# Patient Record
Sex: Female | Born: 1965 | Race: White | Hispanic: No | Marital: Married | Smoking: Never smoker
Health system: Southern US, Community
[De-identification: ages and names within clinical notes are randomized; demographics above are authoritative.]

## PROBLEM LIST (undated history)

## (undated) DIAGNOSIS — I10 Essential (primary) hypertension: Secondary | ICD-10-CM

## (undated) HISTORY — DX: Essential (primary) hypertension: I10

---

## 1997-06-17 ENCOUNTER — Other Ambulatory Visit: Admission: RE | Admit: 1997-06-17 | Discharge: 1997-06-17 | Payer: Self-pay | Admitting: Obstetrics and Gynecology

## 1998-06-23 ENCOUNTER — Other Ambulatory Visit: Admission: RE | Admit: 1998-06-23 | Discharge: 1998-06-23 | Payer: Self-pay | Admitting: Obstetrics and Gynecology

## 1999-07-01 ENCOUNTER — Other Ambulatory Visit: Admission: RE | Admit: 1999-07-01 | Discharge: 1999-07-01 | Payer: Self-pay | Admitting: Obstetrics and Gynecology

## 2000-06-27 ENCOUNTER — Other Ambulatory Visit: Admission: RE | Admit: 2000-06-27 | Discharge: 2000-06-27 | Payer: Self-pay | Admitting: Obstetrics and Gynecology

## 2001-07-06 ENCOUNTER — Other Ambulatory Visit: Admission: RE | Admit: 2001-07-06 | Discharge: 2001-07-06 | Payer: Self-pay | Admitting: Obstetrics and Gynecology

## 2002-07-09 ENCOUNTER — Other Ambulatory Visit: Admission: RE | Admit: 2002-07-09 | Discharge: 2002-07-09 | Payer: Self-pay | Admitting: Obstetrics and Gynecology

## 2003-07-23 ENCOUNTER — Other Ambulatory Visit: Admission: RE | Admit: 2003-07-23 | Discharge: 2003-07-23 | Payer: Self-pay | Admitting: Obstetrics and Gynecology

## 2004-08-06 ENCOUNTER — Other Ambulatory Visit: Admission: RE | Admit: 2004-08-06 | Discharge: 2004-08-06 | Payer: Self-pay | Admitting: Obstetrics and Gynecology

## 2005-10-27 ENCOUNTER — Ambulatory Visit (HOSPITAL_COMMUNITY): Admission: RE | Admit: 2005-10-27 | Discharge: 2005-10-27 | Payer: Self-pay | Admitting: Obstetrics and Gynecology

## 2006-02-02 ENCOUNTER — Ambulatory Visit (HOSPITAL_COMMUNITY): Admission: RE | Admit: 2006-02-02 | Discharge: 2006-02-02 | Payer: Self-pay | Admitting: Obstetrics and Gynecology

## 2010-03-14 ENCOUNTER — Encounter: Payer: Self-pay | Admitting: Obstetrics and Gynecology

## 2011-02-10 ENCOUNTER — Other Ambulatory Visit: Payer: Self-pay | Admitting: Obstetrics and Gynecology

## 2011-02-10 DIAGNOSIS — R928 Other abnormal and inconclusive findings on diagnostic imaging of breast: Secondary | ICD-10-CM

## 2011-02-18 ENCOUNTER — Ambulatory Visit
Admission: RE | Admit: 2011-02-18 | Discharge: 2011-02-18 | Disposition: A | Discharge status: Home / Self Care | Payer: BC Managed Care – PPO | Source: Ambulatory Visit | Attending: Obstetrics and Gynecology | Admitting: Obstetrics and Gynecology

## 2011-02-18 DIAGNOSIS — R928 Other abnormal and inconclusive findings on diagnostic imaging of breast: Secondary | ICD-10-CM

## 2011-02-18 IMAGING — MG MM DIGITAL DIAGNOSTIC UNILAT*R*
3 series · 3 of 3 positions shown · non-contrast
Comparison: Prior studies

CLINICAL DATA: Recall from screening mammography.

DIGITAL DIAGNOSTIC RIGHT BREAST MAMMOGRAM

[R CC (1 of 2)]
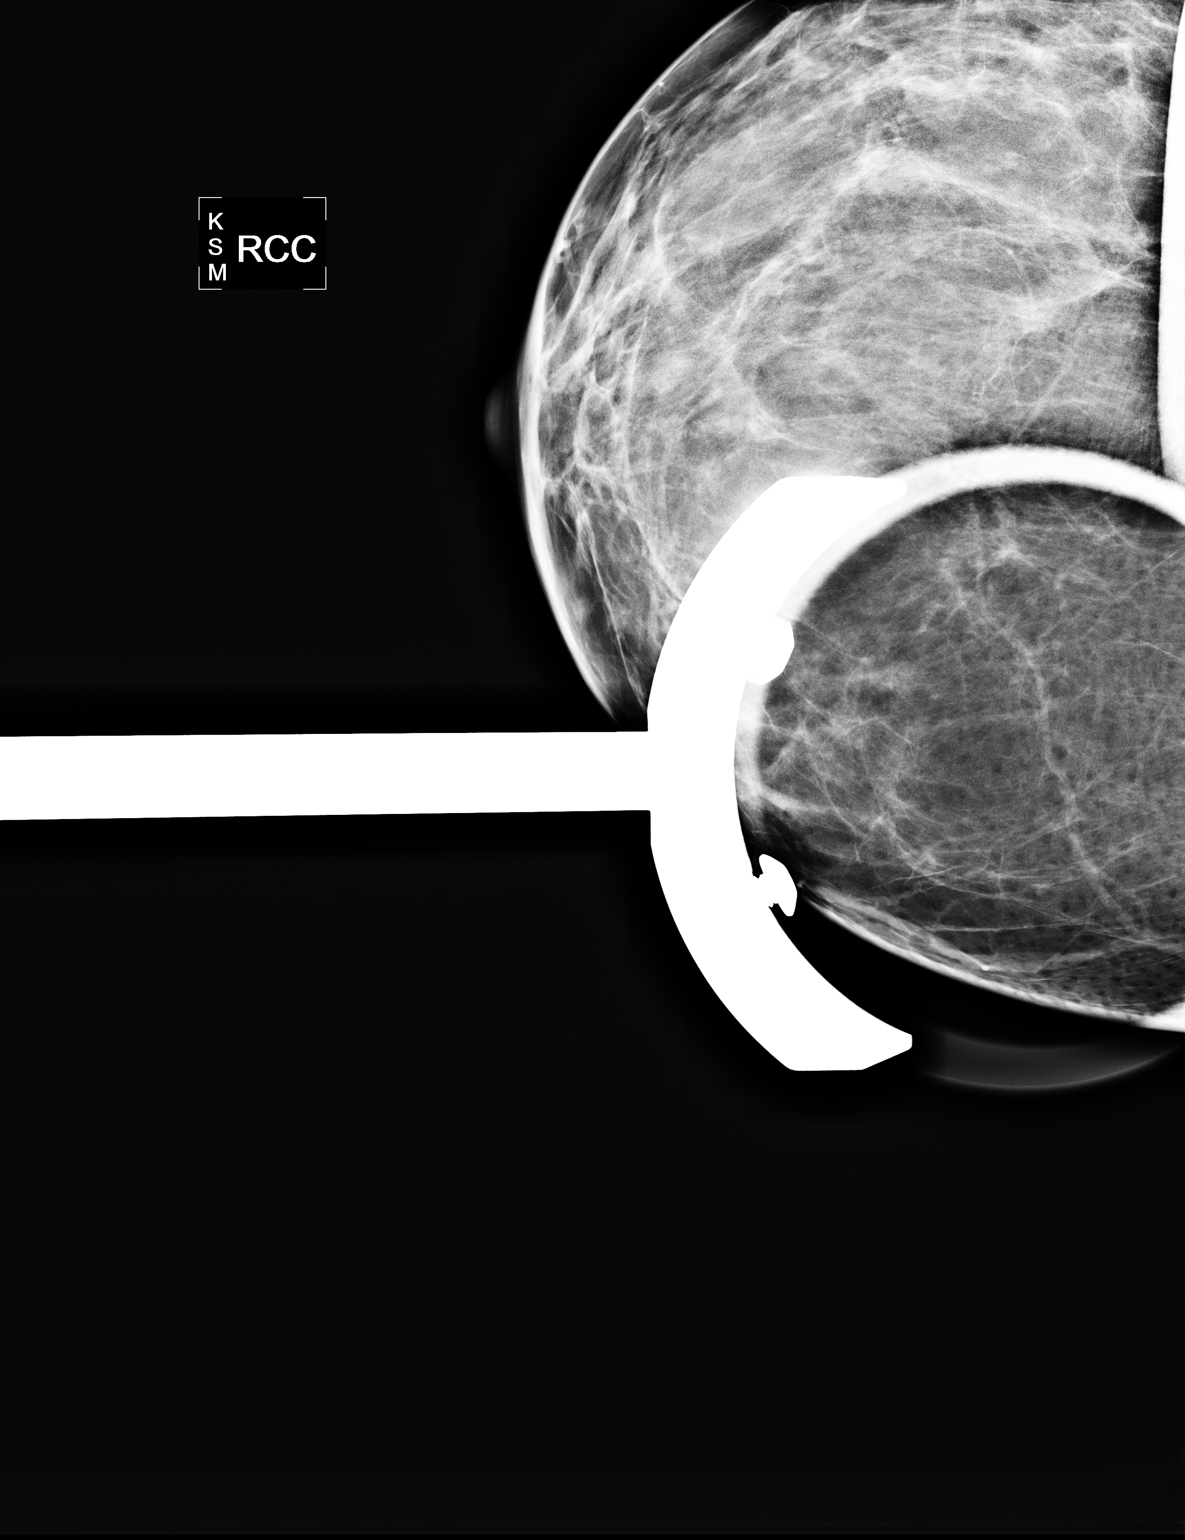

[R MLO]
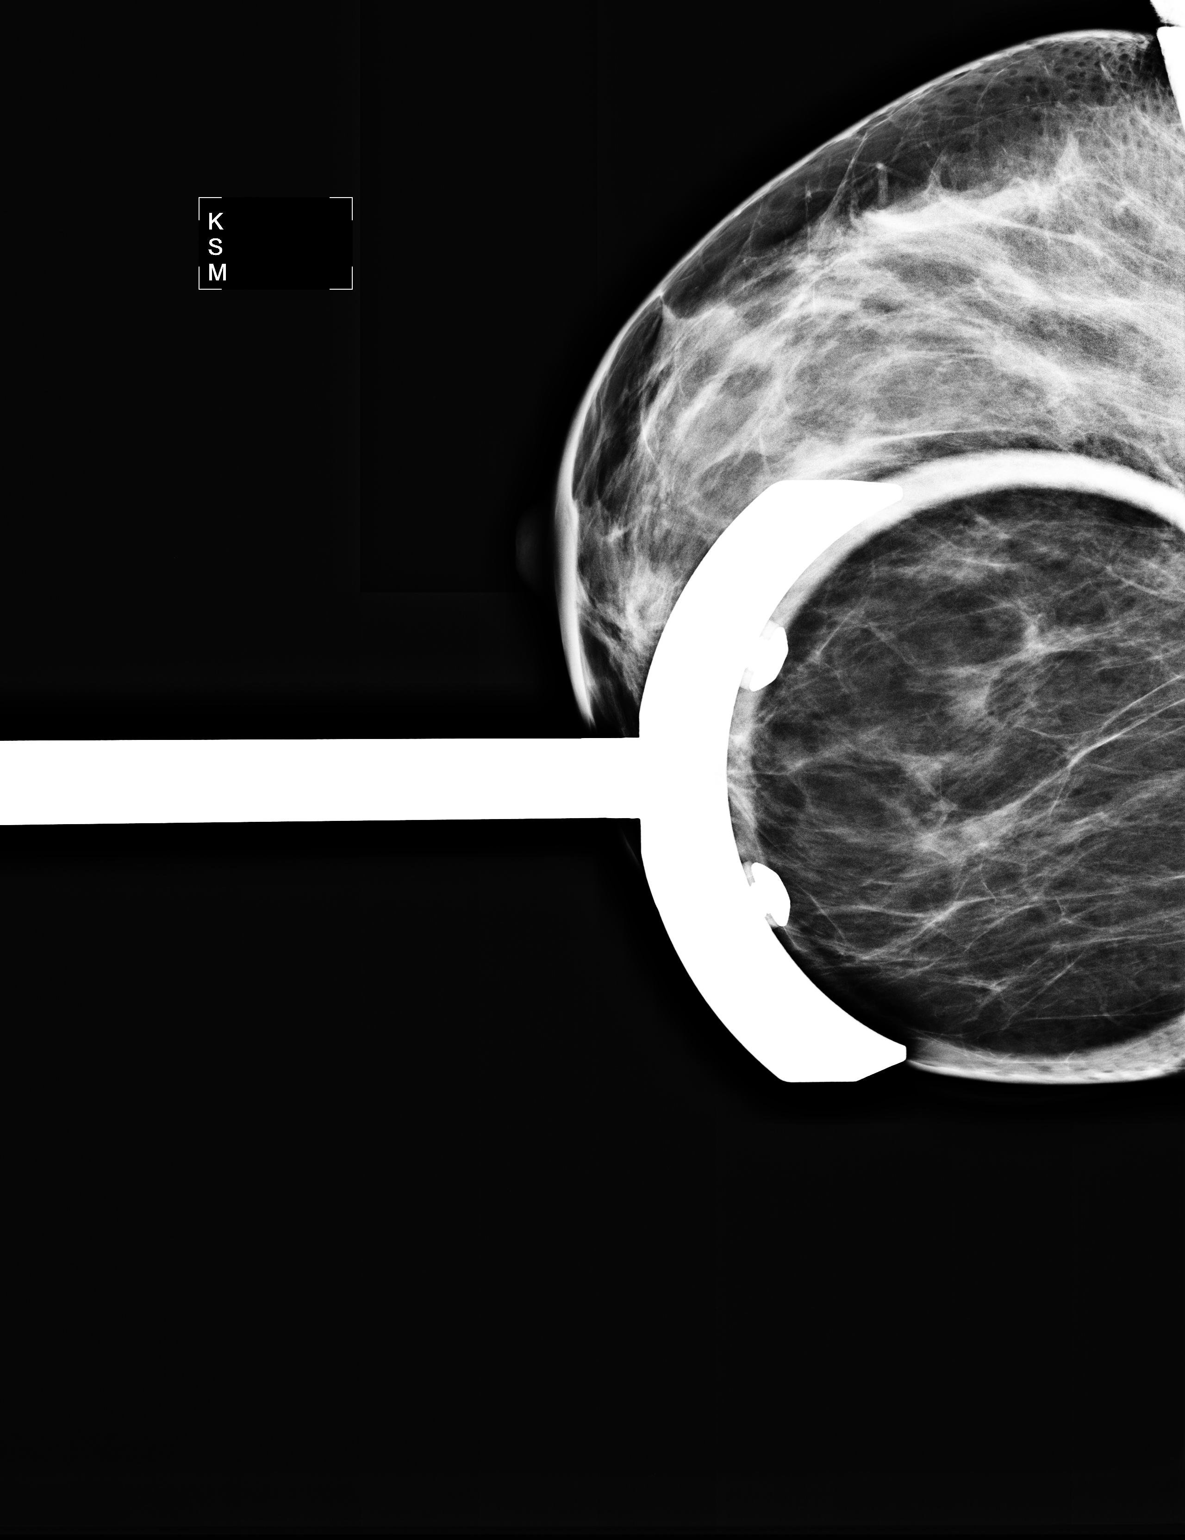

[R CC (2 of 2)]
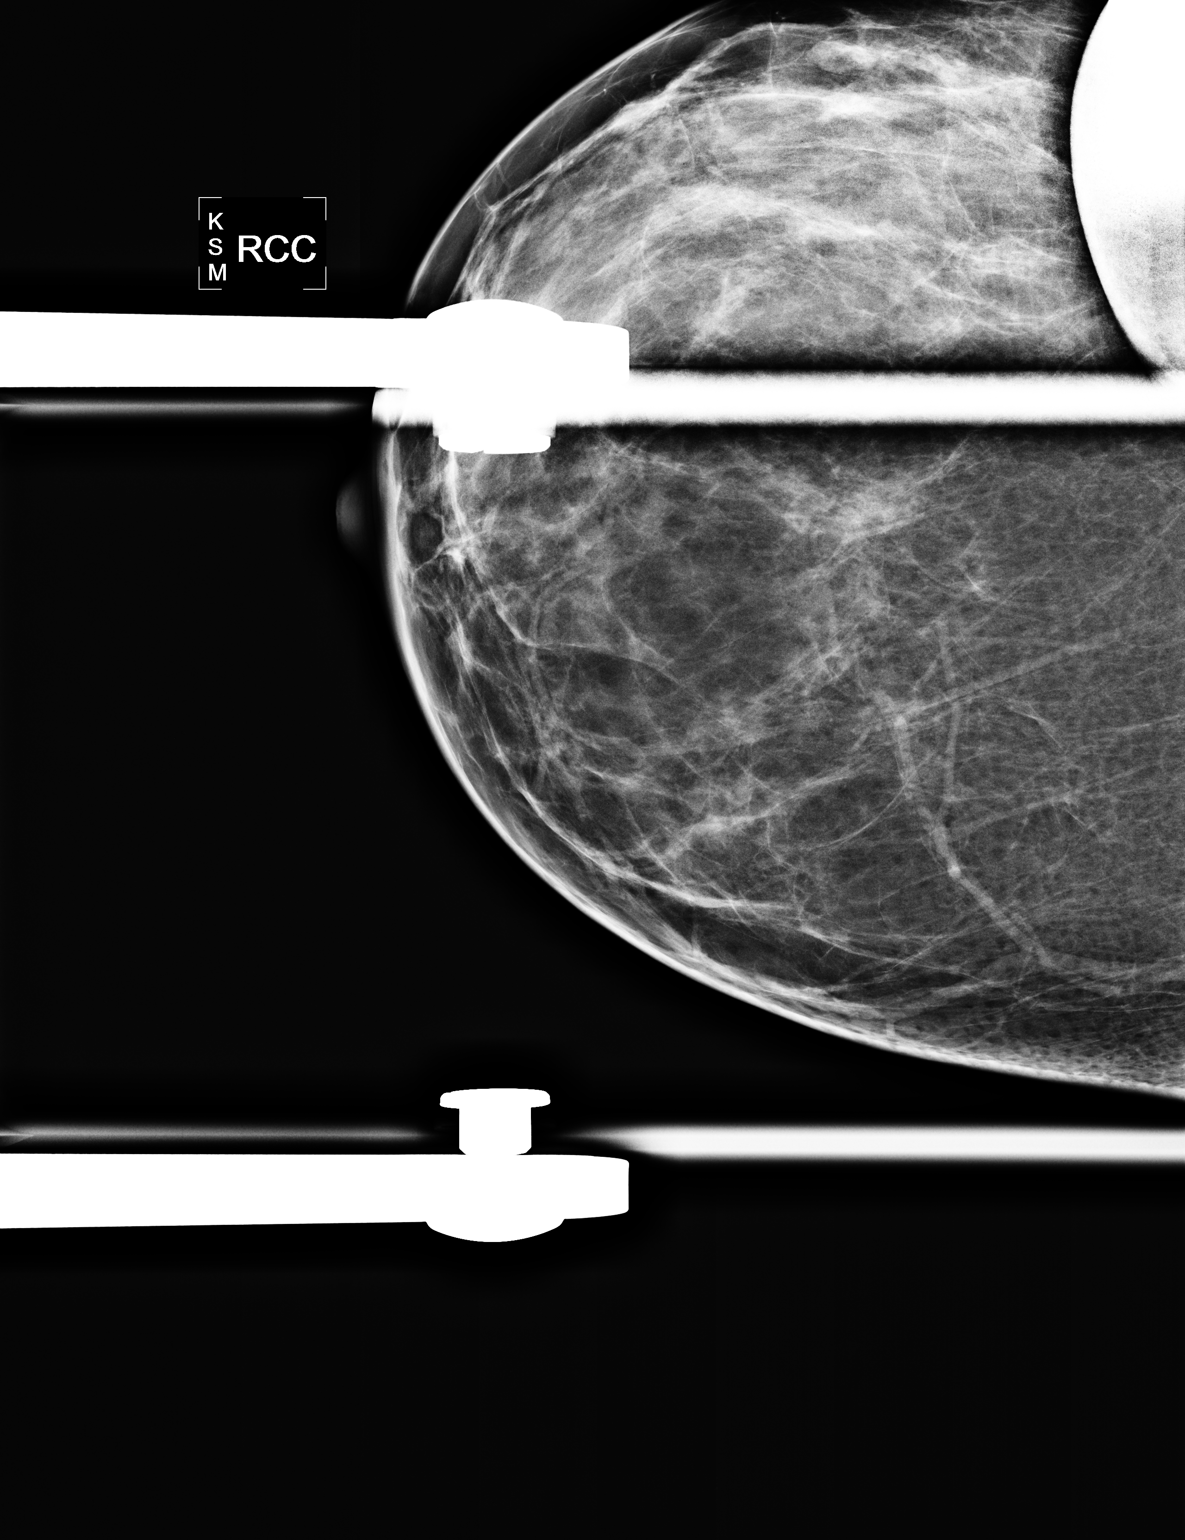

[3 of 3 positions shown; findings below may reference images not displayed]

FINDINGS: Additional views of the right breast demonstrate no
persistent abnormality.  The appearance is consistent with a
summation shadow.
IMPRESSION: No persistent abnormality on additional evaluation of the right
breast.  The appearance is consistent with a summation shadow.
Recommend screening mammography in 1 year.

BI-RADS CATEGORY 1:  Negative.

## 2013-01-30 ENCOUNTER — Ambulatory Visit: Payer: BC Managed Care – PPO | Admitting: Podiatry

## 2013-01-30 ENCOUNTER — Encounter: Payer: Self-pay | Admitting: Podiatry

## 2013-01-30 VITALS — BP 149/88 | HR 91 | Resp 16 | Ht 64.0 in | Wt 145.0 lb

## 2013-01-30 DIAGNOSIS — B351 Tinea unguium: Secondary | ICD-10-CM

## 2013-01-30 NOTE — Progress Notes (Signed)
   Subjective:    Patient ID: Courtney Mosley, female    DOB: 06/21/1965, 47 y.o.   MRN: [id number]  HPI Comments: "My toenails look terrible"  Pt c/o of discolored toenails, 1st bilateral. Been like this for a few years. Not sore. Hasn't used any treatments on them. Notices that they look worse recently, splitting.     Review of Systems  Skin:       Change in nails       Objective:   Physical Exam        Assessment & Plan:

## 2013-01-30 NOTE — Patient Instructions (Signed)

## 2013-01-30 NOTE — Progress Notes (Signed)
Subjective:     Patient ID: Courtney Mosley, female   DOB: Jan 14, 1966, 47 y.o.   MRN: [id number]  HPI patient presents stating I have discoloration my big toenails that does not hurt but was concerning to me   Review of Systems     Objective:   Physical Exam Neurovascular status intact with no health history changes noted. 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 If improvement not forthcoming in the next 3 months we will consider laser surgery

## 2013-04-29 ENCOUNTER — Ambulatory Visit: Payer: BC Managed Care – PPO | Admitting: Podiatry

## 2013-05-01 ENCOUNTER — Encounter: Payer: Self-pay | Admitting: Podiatry

## 2013-05-01 ENCOUNTER — Ambulatory Visit (INDEPENDENT_AMBULATORY_CARE_PROVIDER_SITE_OTHER): Payer: BC Managed Care – PPO | Admitting: Podiatry

## 2013-05-01 VITALS — BP 162/95 | HR 94 | Resp 15 | Ht 64.0 in | Wt 142.0 lb

## 2013-05-01 DIAGNOSIS — B351 Tinea unguium: Secondary | ICD-10-CM

## 2013-05-01 MED ORDER — TERBINAFINE HCL 250 MG PO TABS: 250.0000 mg | ORAL_TABLET | Freq: Every day | ORAL | Status: AC

## 2013-05-01 NOTE — Progress Notes (Signed)
Subjective:     Patient ID: Courtney AgeeJennifer M Civil, female   DOB: 1965-07-18, 48 y.o.   MRN: [id number]  HPI patient states my nails are quite improved. Still some slight discoloration at the end of the toenails   Review of Systems     Objective:   Physical Exam Neurovascular status intact with slight distal changes but markedly improvement from previous    Assessment:     Improved from mycotic nail infection with oral medication and topical    Plan:     H&P performed and today we'll start 30 days once year oral Lamisil and hopefully keep this under control with topical. Reappoint as needed

## 2013-05-01 NOTE — Progress Notes (Signed)
Pt presents for follow up on discolored toenails B/L 1st.  Pt states they look better.

## 2015-06-11 DIAGNOSIS — Z1231 Encounter for screening mammogram for malignant neoplasm of breast: Secondary | ICD-10-CM | POA: Diagnosis not present

## 2015-06-11 DIAGNOSIS — Z01419 Encounter for gynecological examination (general) (routine) without abnormal findings: Secondary | ICD-10-CM | POA: Diagnosis not present

## 2015-06-11 DIAGNOSIS — Z6826 Body mass index (BMI) 26.0-26.9, adult: Secondary | ICD-10-CM | POA: Diagnosis not present

## 2015-07-09 DIAGNOSIS — Z6826 Body mass index (BMI) 26.0-26.9, adult: Secondary | ICD-10-CM | POA: Diagnosis not present

## 2015-07-09 DIAGNOSIS — Z713 Dietary counseling and surveillance: Secondary | ICD-10-CM | POA: Diagnosis not present

## 2015-08-06 DIAGNOSIS — Z6825 Body mass index (BMI) 25.0-25.9, adult: Secondary | ICD-10-CM | POA: Diagnosis not present

## 2015-08-06 DIAGNOSIS — Z713 Dietary counseling and surveillance: Secondary | ICD-10-CM | POA: Diagnosis not present

## 2015-09-03 DIAGNOSIS — Z808 Family history of malignant neoplasm of other organs or systems: Secondary | ICD-10-CM | POA: Diagnosis not present

## 2015-09-03 DIAGNOSIS — L814 Other melanin hyperpigmentation: Secondary | ICD-10-CM | POA: Diagnosis not present

## 2015-09-03 DIAGNOSIS — D225 Melanocytic nevi of trunk: Secondary | ICD-10-CM | POA: Diagnosis not present

## 2015-09-03 DIAGNOSIS — Z86018 Personal history of other benign neoplasm: Secondary | ICD-10-CM | POA: Diagnosis not present

## 2015-09-07 DIAGNOSIS — Z6825 Body mass index (BMI) 25.0-25.9, adult: Secondary | ICD-10-CM | POA: Diagnosis not present

## 2015-09-07 DIAGNOSIS — Z713 Dietary counseling and surveillance: Secondary | ICD-10-CM | POA: Diagnosis not present

## 2015-11-10 DIAGNOSIS — E7800 Pure hypercholesterolemia, unspecified: Secondary | ICD-10-CM | POA: Diagnosis not present

## 2015-11-10 DIAGNOSIS — Z0000 Encounter for general adult medical examination without abnormal findings: Secondary | ICD-10-CM | POA: Diagnosis not present

## 2015-11-10 DIAGNOSIS — I10 Essential (primary) hypertension: Secondary | ICD-10-CM | POA: Diagnosis not present

## 2016-06-13 DIAGNOSIS — Z1231 Encounter for screening mammogram for malignant neoplasm of breast: Secondary | ICD-10-CM | POA: Diagnosis not present

## 2016-06-13 DIAGNOSIS — Z6826 Body mass index (BMI) 26.0-26.9, adult: Secondary | ICD-10-CM | POA: Diagnosis not present

## 2016-06-13 DIAGNOSIS — Z01419 Encounter for gynecological examination (general) (routine) without abnormal findings: Secondary | ICD-10-CM | POA: Diagnosis not present

## 2016-06-21 DIAGNOSIS — N924 Excessive bleeding in the premenopausal period: Secondary | ICD-10-CM | POA: Diagnosis not present

## 2016-07-07 DIAGNOSIS — I10 Essential (primary) hypertension: Secondary | ICD-10-CM | POA: Diagnosis not present

## 2016-07-07 DIAGNOSIS — Z713 Dietary counseling and surveillance: Secondary | ICD-10-CM | POA: Diagnosis not present

## 2016-07-07 DIAGNOSIS — Z6826 Body mass index (BMI) 26.0-26.9, adult: Secondary | ICD-10-CM | POA: Diagnosis not present

## 2016-07-25 DIAGNOSIS — H35422 Microcystoid degeneration of retina, left eye: Secondary | ICD-10-CM | POA: Diagnosis not present

## 2016-07-25 DIAGNOSIS — H33193 Other retinoschisis and retinal cysts, bilateral: Secondary | ICD-10-CM | POA: Diagnosis not present

## 2016-07-25 DIAGNOSIS — H35371 Puckering of macula, right eye: Secondary | ICD-10-CM | POA: Diagnosis not present

## 2016-08-10 DIAGNOSIS — Z713 Dietary counseling and surveillance: Secondary | ICD-10-CM | POA: Diagnosis not present

## 2016-08-10 DIAGNOSIS — Z6825 Body mass index (BMI) 25.0-25.9, adult: Secondary | ICD-10-CM | POA: Diagnosis not present

## 2016-09-08 DIAGNOSIS — L814 Other melanin hyperpigmentation: Secondary | ICD-10-CM | POA: Diagnosis not present

## 2016-09-08 DIAGNOSIS — D1800 Hemangioma unspecified site: Secondary | ICD-10-CM | POA: Diagnosis not present

## 2016-09-08 DIAGNOSIS — L821 Other seborrheic keratosis: Secondary | ICD-10-CM | POA: Diagnosis not present

## 2016-09-08 DIAGNOSIS — D225 Melanocytic nevi of trunk: Secondary | ICD-10-CM | POA: Diagnosis not present

## 2016-09-14 DIAGNOSIS — Z6825 Body mass index (BMI) 25.0-25.9, adult: Secondary | ICD-10-CM | POA: Diagnosis not present

## 2016-09-14 DIAGNOSIS — N912 Amenorrhea, unspecified: Secondary | ICD-10-CM | POA: Diagnosis not present

## 2016-09-14 DIAGNOSIS — Z713 Dietary counseling and surveillance: Secondary | ICD-10-CM | POA: Diagnosis not present

## 2016-09-22 DIAGNOSIS — M25512 Pain in left shoulder: Secondary | ICD-10-CM | POA: Diagnosis not present

## 2016-10-13 DIAGNOSIS — Z0000 Encounter for general adult medical examination without abnormal findings: Secondary | ICD-10-CM | POA: Diagnosis not present

## 2016-10-13 DIAGNOSIS — E7800 Pure hypercholesterolemia, unspecified: Secondary | ICD-10-CM | POA: Diagnosis not present

## 2016-10-13 DIAGNOSIS — I10 Essential (primary) hypertension: Secondary | ICD-10-CM | POA: Diagnosis not present

## 2017-05-19 DIAGNOSIS — N951 Menopausal and female climacteric states: Secondary | ICD-10-CM | POA: Diagnosis not present

## 2017-05-19 DIAGNOSIS — R635 Abnormal weight gain: Secondary | ICD-10-CM | POA: Diagnosis not present

## 2017-05-22 DIAGNOSIS — Z1331 Encounter for screening for depression: Secondary | ICD-10-CM | POA: Diagnosis not present

## 2017-05-22 DIAGNOSIS — E782 Mixed hyperlipidemia: Secondary | ICD-10-CM | POA: Diagnosis not present

## 2017-05-22 DIAGNOSIS — R232 Flushing: Secondary | ICD-10-CM | POA: Diagnosis not present

## 2017-05-22 DIAGNOSIS — I10 Essential (primary) hypertension: Secondary | ICD-10-CM | POA: Diagnosis not present

## 2017-05-22 DIAGNOSIS — E559 Vitamin D deficiency, unspecified: Secondary | ICD-10-CM | POA: Diagnosis not present

## 2017-05-22 DIAGNOSIS — Z1339 Encounter for screening examination for other mental health and behavioral disorders: Secondary | ICD-10-CM | POA: Diagnosis not present

## 2017-05-29 DIAGNOSIS — E782 Mixed hyperlipidemia: Secondary | ICD-10-CM | POA: Diagnosis not present

## 2017-05-29 DIAGNOSIS — I10 Essential (primary) hypertension: Secondary | ICD-10-CM | POA: Diagnosis not present

## 2017-06-05 DIAGNOSIS — I10 Essential (primary) hypertension: Secondary | ICD-10-CM | POA: Diagnosis not present

## 2017-06-12 DIAGNOSIS — I10 Essential (primary) hypertension: Secondary | ICD-10-CM | POA: Diagnosis not present

## 2017-06-19 DIAGNOSIS — E559 Vitamin D deficiency, unspecified: Secondary | ICD-10-CM | POA: Diagnosis not present

## 2017-06-19 DIAGNOSIS — E782 Mixed hyperlipidemia: Secondary | ICD-10-CM | POA: Diagnosis not present

## 2017-06-19 DIAGNOSIS — I10 Essential (primary) hypertension: Secondary | ICD-10-CM | POA: Diagnosis not present

## 2017-06-26 DIAGNOSIS — I10 Essential (primary) hypertension: Secondary | ICD-10-CM | POA: Diagnosis not present

## 2017-07-20 DIAGNOSIS — Z1231 Encounter for screening mammogram for malignant neoplasm of breast: Secondary | ICD-10-CM | POA: Diagnosis not present

## 2017-07-20 DIAGNOSIS — Z6824 Body mass index (BMI) 24.0-24.9, adult: Secondary | ICD-10-CM | POA: Diagnosis not present

## 2017-07-20 DIAGNOSIS — Z01419 Encounter for gynecological examination (general) (routine) without abnormal findings: Secondary | ICD-10-CM | POA: Diagnosis not present

## 2017-07-27 DIAGNOSIS — Z13820 Encounter for screening for osteoporosis: Secondary | ICD-10-CM | POA: Diagnosis not present

## 2017-08-07 DIAGNOSIS — D225 Melanocytic nevi of trunk: Secondary | ICD-10-CM | POA: Diagnosis not present

## 2017-08-07 DIAGNOSIS — D1800 Hemangioma unspecified site: Secondary | ICD-10-CM | POA: Diagnosis not present

## 2017-08-07 DIAGNOSIS — L814 Other melanin hyperpigmentation: Secondary | ICD-10-CM | POA: Diagnosis not present

## 2017-08-07 DIAGNOSIS — L821 Other seborrheic keratosis: Secondary | ICD-10-CM | POA: Diagnosis not present

## 2017-09-07 ENCOUNTER — Ambulatory Visit (INDEPENDENT_AMBULATORY_CARE_PROVIDER_SITE_OTHER): Payer: Self-pay | Admitting: Otolaryngology

## 2017-10-19 DIAGNOSIS — Z23 Encounter for immunization: Secondary | ICD-10-CM | POA: Diagnosis not present

## 2017-10-19 DIAGNOSIS — Z0000 Encounter for general adult medical examination without abnormal findings: Secondary | ICD-10-CM | POA: Diagnosis not present

## 2017-10-19 DIAGNOSIS — E7800 Pure hypercholesterolemia, unspecified: Secondary | ICD-10-CM | POA: Diagnosis not present

## 2017-10-19 DIAGNOSIS — I10 Essential (primary) hypertension: Secondary | ICD-10-CM | POA: Diagnosis not present

## 2018-02-23 DIAGNOSIS — D485 Neoplasm of uncertain behavior of skin: Secondary | ICD-10-CM | POA: Diagnosis not present

## 2018-02-23 DIAGNOSIS — D1801 Hemangioma of skin and subcutaneous tissue: Secondary | ICD-10-CM | POA: Diagnosis not present

## 2018-07-12 DIAGNOSIS — R635 Abnormal weight gain: Secondary | ICD-10-CM | POA: Diagnosis not present

## 2018-07-12 DIAGNOSIS — Z6828 Body mass index (BMI) 28.0-28.9, adult: Secondary | ICD-10-CM | POA: Diagnosis not present

## 2018-08-09 DIAGNOSIS — Z6831 Body mass index (BMI) 31.0-31.9, adult: Secondary | ICD-10-CM | POA: Diagnosis not present

## 2018-08-09 DIAGNOSIS — Z713 Dietary counseling and surveillance: Secondary | ICD-10-CM | POA: Diagnosis not present

## 2018-08-13 DIAGNOSIS — L821 Other seborrheic keratosis: Secondary | ICD-10-CM | POA: Diagnosis not present

## 2018-08-13 DIAGNOSIS — Z86018 Personal history of other benign neoplasm: Secondary | ICD-10-CM | POA: Diagnosis not present

## 2018-08-13 DIAGNOSIS — L814 Other melanin hyperpigmentation: Secondary | ICD-10-CM | POA: Diagnosis not present

## 2018-08-13 DIAGNOSIS — D225 Melanocytic nevi of trunk: Secondary | ICD-10-CM | POA: Diagnosis not present

## 2018-09-24 ENCOUNTER — Ambulatory Visit (INDEPENDENT_AMBULATORY_CARE_PROVIDER_SITE_OTHER): Payer: BLUE CROSS/BLUE SHIELD | Admitting: Otolaryngology

## 2018-11-22 DIAGNOSIS — Z6828 Body mass index (BMI) 28.0-28.9, adult: Secondary | ICD-10-CM | POA: Diagnosis not present

## 2018-11-22 DIAGNOSIS — Z01419 Encounter for gynecological examination (general) (routine) without abnormal findings: Secondary | ICD-10-CM | POA: Diagnosis not present

## 2018-11-22 DIAGNOSIS — G4700 Insomnia, unspecified: Secondary | ICD-10-CM | POA: Diagnosis not present

## 2018-11-22 DIAGNOSIS — Z1231 Encounter for screening mammogram for malignant neoplasm of breast: Secondary | ICD-10-CM | POA: Diagnosis not present

## 2018-12-09 DIAGNOSIS — Z1211 Encounter for screening for malignant neoplasm of colon: Secondary | ICD-10-CM | POA: Diagnosis not present

## 2019-01-07 DIAGNOSIS — R195 Other fecal abnormalities: Secondary | ICD-10-CM | POA: Diagnosis not present

## 2019-03-27 DIAGNOSIS — Z1159 Encounter for screening for other viral diseases: Secondary | ICD-10-CM | POA: Diagnosis not present

## 2019-03-29 DIAGNOSIS — H33193 Other retinoschisis and retinal cysts, bilateral: Secondary | ICD-10-CM | POA: Diagnosis not present

## 2019-03-29 DIAGNOSIS — H35422 Microcystoid degeneration of retina, left eye: Secondary | ICD-10-CM | POA: Diagnosis not present

## 2019-03-29 DIAGNOSIS — H43813 Vitreous degeneration, bilateral: Secondary | ICD-10-CM | POA: Diagnosis not present

## 2019-03-29 DIAGNOSIS — H35371 Puckering of macula, right eye: Secondary | ICD-10-CM | POA: Diagnosis not present

## 2019-04-01 DIAGNOSIS — R195 Other fecal abnormalities: Secondary | ICD-10-CM | POA: Diagnosis not present

## 2019-04-01 DIAGNOSIS — D122 Benign neoplasm of ascending colon: Secondary | ICD-10-CM | POA: Diagnosis not present

## 2019-05-06 DIAGNOSIS — Z0000 Encounter for general adult medical examination without abnormal findings: Secondary | ICD-10-CM | POA: Diagnosis not present

## 2019-05-06 DIAGNOSIS — E7800 Pure hypercholesterolemia, unspecified: Secondary | ICD-10-CM | POA: Diagnosis not present

## 2019-05-06 DIAGNOSIS — D126 Benign neoplasm of colon, unspecified: Secondary | ICD-10-CM | POA: Diagnosis not present

## 2019-05-06 DIAGNOSIS — I10 Essential (primary) hypertension: Secondary | ICD-10-CM | POA: Diagnosis not present

## 2019-11-07 DIAGNOSIS — E7800 Pure hypercholesterolemia, unspecified: Secondary | ICD-10-CM | POA: Diagnosis not present

## 2020-01-14 DIAGNOSIS — L814 Other melanin hyperpigmentation: Secondary | ICD-10-CM | POA: Diagnosis not present

## 2020-01-14 DIAGNOSIS — L821 Other seborrheic keratosis: Secondary | ICD-10-CM | POA: Diagnosis not present

## 2020-01-14 DIAGNOSIS — Z86018 Personal history of other benign neoplasm: Secondary | ICD-10-CM | POA: Diagnosis not present

## 2020-01-14 DIAGNOSIS — D225 Melanocytic nevi of trunk: Secondary | ICD-10-CM | POA: Diagnosis not present

## 2020-03-18 DIAGNOSIS — Z1231 Encounter for screening mammogram for malignant neoplasm of breast: Secondary | ICD-10-CM | POA: Diagnosis not present

## 2020-03-18 DIAGNOSIS — Z6827 Body mass index (BMI) 27.0-27.9, adult: Secondary | ICD-10-CM | POA: Diagnosis not present

## 2020-03-18 DIAGNOSIS — Z01419 Encounter for gynecological examination (general) (routine) without abnormal findings: Secondary | ICD-10-CM | POA: Diagnosis not present

## 2021-01-19 DIAGNOSIS — L821 Other seborrheic keratosis: Secondary | ICD-10-CM | POA: Diagnosis not present

## 2021-01-19 DIAGNOSIS — D225 Melanocytic nevi of trunk: Secondary | ICD-10-CM | POA: Diagnosis not present

## 2021-01-19 DIAGNOSIS — Z86018 Personal history of other benign neoplasm: Secondary | ICD-10-CM | POA: Diagnosis not present

## 2021-01-19 DIAGNOSIS — L814 Other melanin hyperpigmentation: Secondary | ICD-10-CM | POA: Diagnosis not present

## 2021-06-22 DIAGNOSIS — R319 Hematuria, unspecified: Secondary | ICD-10-CM | POA: Diagnosis not present

## 2021-06-22 DIAGNOSIS — Z13220 Encounter for screening for lipoid disorders: Secondary | ICD-10-CM | POA: Diagnosis not present

## 2021-06-22 DIAGNOSIS — Z130 Encounter for screening for diseases of the blood and blood-forming organs and certain disorders involving the immune mechanism: Secondary | ICD-10-CM | POA: Diagnosis not present

## 2021-06-22 DIAGNOSIS — Z01419 Encounter for gynecological examination (general) (routine) without abnormal findings: Secondary | ICD-10-CM | POA: Diagnosis not present

## 2021-06-22 DIAGNOSIS — Z131 Encounter for screening for diabetes mellitus: Secondary | ICD-10-CM | POA: Diagnosis not present

## 2021-06-22 DIAGNOSIS — Z1231 Encounter for screening mammogram for malignant neoplasm of breast: Secondary | ICD-10-CM | POA: Diagnosis not present

## 2021-06-22 DIAGNOSIS — Z13228 Encounter for screening for other metabolic disorders: Secondary | ICD-10-CM | POA: Diagnosis not present

## 2021-06-22 DIAGNOSIS — Z6828 Body mass index (BMI) 28.0-28.9, adult: Secondary | ICD-10-CM | POA: Diagnosis not present

## 2021-06-29 DIAGNOSIS — L570 Actinic keratosis: Secondary | ICD-10-CM | POA: Diagnosis not present

## 2021-07-08 DIAGNOSIS — I10 Essential (primary) hypertension: Secondary | ICD-10-CM | POA: Diagnosis not present

## 2021-07-08 DIAGNOSIS — Z0000 Encounter for general adult medical examination without abnormal findings: Secondary | ICD-10-CM | POA: Diagnosis not present

## 2021-07-08 DIAGNOSIS — E7800 Pure hypercholesterolemia, unspecified: Secondary | ICD-10-CM | POA: Diagnosis not present

## 2021-10-26 DIAGNOSIS — E7800 Pure hypercholesterolemia, unspecified: Secondary | ICD-10-CM | POA: Diagnosis not present

## 2022-02-01 DIAGNOSIS — D225 Melanocytic nevi of trunk: Secondary | ICD-10-CM | POA: Diagnosis not present

## 2022-02-01 DIAGNOSIS — L821 Other seborrheic keratosis: Secondary | ICD-10-CM | POA: Diagnosis not present

## 2022-02-01 DIAGNOSIS — Z86018 Personal history of other benign neoplasm: Secondary | ICD-10-CM | POA: Diagnosis not present

## 2022-02-01 DIAGNOSIS — L814 Other melanin hyperpigmentation: Secondary | ICD-10-CM | POA: Diagnosis not present

## 2022-06-22 DIAGNOSIS — N949 Unspecified condition associated with female genital organs and menstrual cycle: Secondary | ICD-10-CM | POA: Diagnosis not present

## 2022-09-27 DIAGNOSIS — Z6828 Body mass index (BMI) 28.0-28.9, adult: Secondary | ICD-10-CM | POA: Diagnosis not present

## 2022-09-27 DIAGNOSIS — Z13820 Encounter for screening for osteoporosis: Secondary | ICD-10-CM | POA: Diagnosis not present

## 2022-09-27 DIAGNOSIS — Z1231 Encounter for screening mammogram for malignant neoplasm of breast: Secondary | ICD-10-CM | POA: Diagnosis not present

## 2022-09-27 DIAGNOSIS — Z13220 Encounter for screening for lipoid disorders: Secondary | ICD-10-CM | POA: Diagnosis not present

## 2022-09-27 DIAGNOSIS — Z131 Encounter for screening for diabetes mellitus: Secondary | ICD-10-CM | POA: Diagnosis not present

## 2022-09-27 DIAGNOSIS — Z1329 Encounter for screening for other suspected endocrine disorder: Secondary | ICD-10-CM | POA: Diagnosis not present

## 2022-09-27 DIAGNOSIS — Z01419 Encounter for gynecological examination (general) (routine) without abnormal findings: Secondary | ICD-10-CM | POA: Diagnosis not present

## 2022-09-27 DIAGNOSIS — Z1321 Encounter for screening for nutritional disorder: Secondary | ICD-10-CM | POA: Diagnosis not present

## 2022-09-27 DIAGNOSIS — Z13228 Encounter for screening for other metabolic disorders: Secondary | ICD-10-CM | POA: Diagnosis not present

## 2022-11-17 DIAGNOSIS — D122 Benign neoplasm of ascending colon: Secondary | ICD-10-CM | POA: Diagnosis not present

## 2022-11-17 DIAGNOSIS — K648 Other hemorrhoids: Secondary | ICD-10-CM | POA: Diagnosis not present

## 2022-11-17 DIAGNOSIS — Z86010 Personal history of colonic polyps: Secondary | ICD-10-CM | POA: Diagnosis not present

## 2022-11-17 DIAGNOSIS — K5730 Diverticulosis of large intestine without perforation or abscess without bleeding: Secondary | ICD-10-CM | POA: Diagnosis not present

## 2022-11-21 DIAGNOSIS — N950 Postmenopausal bleeding: Secondary | ICD-10-CM | POA: Diagnosis not present

## 2022-11-21 DIAGNOSIS — E559 Vitamin D deficiency, unspecified: Secondary | ICD-10-CM | POA: Diagnosis not present

## 2022-11-21 DIAGNOSIS — R945 Abnormal results of liver function studies: Secondary | ICD-10-CM | POA: Diagnosis not present

## 2022-12-05 DIAGNOSIS — N950 Postmenopausal bleeding: Secondary | ICD-10-CM | POA: Diagnosis not present

## 2022-12-05 DIAGNOSIS — N939 Abnormal uterine and vaginal bleeding, unspecified: Secondary | ICD-10-CM | POA: Diagnosis not present

## 2022-12-20 ENCOUNTER — Other Ambulatory Visit (HOSPITAL_COMMUNITY): Payer: Self-pay

## 2022-12-20 MED ORDER — INFLUENZA VIRUS VACC SPLIT PF (FLUZONE) 0.5 ML IM SUSY
0.5000 mL | PREFILLED_SYRINGE | Freq: Once | INTRAMUSCULAR | 0 refills | Status: AC
  Filled 2022-12-20: qty 0.5, 1d supply, fill #0

## 2023-03-06 DIAGNOSIS — D225 Melanocytic nevi of trunk: Secondary | ICD-10-CM | POA: Diagnosis not present

## 2023-03-06 DIAGNOSIS — Z86018 Personal history of other benign neoplasm: Secondary | ICD-10-CM | POA: Diagnosis not present

## 2023-03-06 DIAGNOSIS — L814 Other melanin hyperpigmentation: Secondary | ICD-10-CM | POA: Diagnosis not present

## 2023-03-06 DIAGNOSIS — L578 Other skin changes due to chronic exposure to nonionizing radiation: Secondary | ICD-10-CM | POA: Diagnosis not present

## 2023-03-06 DIAGNOSIS — L821 Other seborrheic keratosis: Secondary | ICD-10-CM | POA: Diagnosis not present

## 2023-06-28 DIAGNOSIS — E7849 Other hyperlipidemia: Secondary | ICD-10-CM | POA: Diagnosis not present

## 2023-06-28 DIAGNOSIS — Z79899 Other long term (current) drug therapy: Secondary | ICD-10-CM | POA: Diagnosis not present

## 2023-11-10 DIAGNOSIS — I10 Essential (primary) hypertension: Secondary | ICD-10-CM | POA: Diagnosis not present

## 2023-11-10 DIAGNOSIS — Z23 Encounter for immunization: Secondary | ICD-10-CM | POA: Diagnosis not present

## 2023-11-10 DIAGNOSIS — N959 Unspecified menopausal and perimenopausal disorder: Secondary | ICD-10-CM | POA: Diagnosis not present

## 2023-11-10 DIAGNOSIS — E7800 Pure hypercholesterolemia, unspecified: Secondary | ICD-10-CM | POA: Diagnosis not present

## 2023-11-10 DIAGNOSIS — Z0000 Encounter for general adult medical examination without abnormal findings: Secondary | ICD-10-CM | POA: Diagnosis not present

## 2023-11-29 ENCOUNTER — Other Ambulatory Visit: Payer: Self-pay | Admitting: Obstetrics and Gynecology

## 2023-11-29 DIAGNOSIS — Z124 Encounter for screening for malignant neoplasm of cervix: Secondary | ICD-10-CM | POA: Diagnosis not present

## 2023-11-29 DIAGNOSIS — Z6829 Body mass index (BMI) 29.0-29.9, adult: Secondary | ICD-10-CM | POA: Diagnosis not present

## 2023-11-29 DIAGNOSIS — Z01419 Encounter for gynecological examination (general) (routine) without abnormal findings: Secondary | ICD-10-CM | POA: Diagnosis not present

## 2023-11-29 DIAGNOSIS — Z8249 Family history of ischemic heart disease and other diseases of the circulatory system: Secondary | ICD-10-CM

## 2023-11-29 DIAGNOSIS — Z1151 Encounter for screening for human papillomavirus (HPV): Secondary | ICD-10-CM | POA: Diagnosis not present

## 2023-12-08 ENCOUNTER — Other Ambulatory Visit

## 2023-12-15 ENCOUNTER — Ambulatory Visit
Admission: RE | Admit: 2023-12-15 | Discharge: 2023-12-15 | Disposition: A | Discharge status: Home / Self Care | Payer: Self-pay | Source: Ambulatory Visit | Attending: Obstetrics and Gynecology | Admitting: Obstetrics and Gynecology

## 2023-12-15 DIAGNOSIS — Z8249 Family history of ischemic heart disease and other diseases of the circulatory system: Secondary | ICD-10-CM

## 2023-12-15 DIAGNOSIS — Z1231 Encounter for screening mammogram for malignant neoplasm of breast: Secondary | ICD-10-CM | POA: Diagnosis not present

## 2024-01-25 DIAGNOSIS — Z6830 Body mass index (BMI) 30.0-30.9, adult: Secondary | ICD-10-CM | POA: Diagnosis not present

## 2024-01-25 DIAGNOSIS — Z713 Dietary counseling and surveillance: Secondary | ICD-10-CM | POA: Diagnosis not present

## 2024-01-26 ENCOUNTER — Other Ambulatory Visit (HOSPITAL_BASED_OUTPATIENT_CLINIC_OR_DEPARTMENT_OTHER): Payer: Self-pay

## 2024-04-29 ENCOUNTER — Ambulatory Visit: Admitting: Cardiology
